# Patient Record
Sex: Male | Born: 1965 | Race: White | Hispanic: No
Health system: Midwestern US, Academic
[De-identification: ages and names within clinical notes are randomized; demographics above are authoritative.]

## PROBLEM LIST (undated history)

## (undated) DIAGNOSIS — N200 Calculus of kidney: Secondary | ICD-10-CM

## (undated) HISTORY — PX: KIDNEY STONE SURGERY: SHX686

## (undated) HISTORY — PX: WRIST SURGERY: SHX841

## (undated) HISTORY — PX: EYE SURGERY: SHX253

---

## 2012-03-25 ENCOUNTER — Emergency Department: Payer: Self-pay | Admitting: Emergency Medicine

## 2012-03-25 LAB — BASIC METABOLIC PANEL
Anion Gap: 9 (ref 7–16)
BUN: 15 mg/dL (ref 7–18)
Calcium, Total: 9.4 mg/dL (ref 8.5–10.1)
Chloride: 110 mmol/L — ABNORMAL HIGH (ref 98–107)
EGFR (Non-African Amer.): 50 — ABNORMAL LOW
Osmolality: 283 (ref 275–301)
Potassium: 4 mmol/L (ref 3.5–5.1)
Sodium: 141 mmol/L (ref 136–145)

## 2012-03-25 LAB — URINALYSIS, COMPLETE
Bacteria: NONE SEEN
Glucose,UR: NEGATIVE mg/dL (ref 0–75)
Leukocyte Esterase: NEGATIVE
Nitrite: NEGATIVE
Protein: NEGATIVE
RBC,UR: 14 /HPF (ref 0–5)
WBC UR: 1 /HPF (ref 0–5)

## 2012-03-25 LAB — CBC
HGB: 14.6 g/dL (ref 12.0–16.0)
Platelet: 269 10*3/uL (ref 150–440)
RBC: 4.11 10*6/uL (ref 3.80–5.20)
WBC: 9.2 10*3/uL (ref 3.6–11.0)

## 2012-11-25 ENCOUNTER — Encounter (HOSPITAL_BASED_OUTPATIENT_CLINIC_OR_DEPARTMENT_OTHER): Payer: Self-pay | Admitting: *Deleted

## 2012-11-25 ENCOUNTER — Emergency Department (HOSPITAL_BASED_OUTPATIENT_CLINIC_OR_DEPARTMENT_OTHER)
Admission: EM | Admit: 2012-11-25 | Discharge: 2012-11-25 | Disposition: A | Discharge status: Home / Self Care | Payer: Self-pay | Attending: Emergency Medicine | Admitting: Emergency Medicine

## 2012-11-25 DIAGNOSIS — R109 Unspecified abdominal pain: Secondary | ICD-10-CM | POA: Insufficient documentation

## 2012-11-25 DIAGNOSIS — Z9889 Other specified postprocedural states: Secondary | ICD-10-CM | POA: Insufficient documentation

## 2012-11-25 DIAGNOSIS — Z87442 Personal history of urinary calculi: Secondary | ICD-10-CM | POA: Insufficient documentation

## 2012-11-25 DIAGNOSIS — N23 Unspecified renal colic: Secondary | ICD-10-CM | POA: Insufficient documentation

## 2012-11-25 DIAGNOSIS — N390 Urinary tract infection, site not specified: Secondary | ICD-10-CM | POA: Insufficient documentation

## 2012-11-25 DIAGNOSIS — F17200 Nicotine dependence, unspecified, uncomplicated: Secondary | ICD-10-CM | POA: Insufficient documentation

## 2012-11-25 HISTORY — DX: Calculus of kidney: N20.0

## 2012-11-25 LAB — URINALYSIS, ROUTINE W REFLEX MICROSCOPIC
Bilirubin Urine: NEGATIVE
Glucose, UA: NEGATIVE mg/dL
Hgb urine dipstick: NEGATIVE
Ketones, ur: NEGATIVE mg/dL
Protein, ur: NEGATIVE mg/dL
pH: 5.5 (ref 5.0–8.0)

## 2012-11-25 LAB — CBC WITH DIFFERENTIAL/PLATELET
Basophils Absolute: 0.1 10*3/uL (ref 0.0–0.1)
Eosinophils Relative: 3 % (ref 0–5)
HCT: 38.1 % — ABNORMAL LOW (ref 39.0–52.0)
Hemoglobin: 13.2 g/dL (ref 13.0–17.0)
Lymphocytes Relative: 10 % — ABNORMAL LOW (ref 12–46)
MCV: 99 fL (ref 78.0–100.0)
Monocytes Absolute: 0.7 10*3/uL (ref 0.1–1.0)
Monocytes Relative: 7 % (ref 3–12)
RDW: 11.9 % (ref 11.5–15.5)
WBC: 9.3 10*3/uL (ref 4.0–10.5)

## 2012-11-25 LAB — BASIC METABOLIC PANEL
BUN: 17 mg/dL (ref 6–23)
CO2: 25 mEq/L (ref 19–32)
Calcium: 9.5 mg/dL (ref 8.4–10.5)
Creatinine, Ser: 1.3 mg/dL (ref 0.50–1.35)
Glucose, Bld: 100 mg/dL — ABNORMAL HIGH (ref 70–99)

## 2012-11-25 LAB — URINE MICROSCOPIC-ADD ON

## 2012-11-25 MED ORDER — ONDANSETRON HCL 4 MG/2ML IJ SOLN
4.0000 mg | Freq: Once | INTRAMUSCULAR | Status: AC
  Administered 2012-11-25: 4 mg via INTRAVENOUS
  Filled 2012-11-25: qty 2

## 2012-11-25 MED ORDER — SULFAMETHOXAZOLE-TMP DS 800-160 MG PO TABS
1.0000 | ORAL_TABLET | Freq: Once | ORAL | Status: AC
  Administered 2012-11-25: 1 via ORAL
  Filled 2012-11-25: qty 1

## 2012-11-25 MED ORDER — HYDROMORPHONE HCL PF 1 MG/ML IJ SOLN
1.0000 mg | Freq: Once | INTRAMUSCULAR | Status: AC
  Administered 2012-11-25: 1 mg via INTRAVENOUS
  Filled 2012-11-25: qty 1

## 2012-11-25 MED ORDER — SULFAMETHOXAZOLE-TRIMETHOPRIM 800-160 MG PO TABS: 1.0000 | ORAL_TABLET | Freq: Two times a day (BID) | ORAL | Status: DC

## 2012-11-25 MED ORDER — OXYCODONE-ACETAMINOPHEN 5-325 MG PO TABS: 1.0000 | ORAL_TABLET | ORAL | Status: DC | PRN

## 2012-11-25 MED ORDER — SODIUM CHLORIDE 0.9 % IV BOLUS (SEPSIS)
1000.0000 mL | Freq: Once | INTRAVENOUS | Status: AC
  Administered 2012-11-25: 1000 mL via INTRAVENOUS

## 2012-11-25 NOTE — ED Provider Notes (Signed)
CSN: [id number]     Arrival date & time 11/25/12  4098 History     First MD Initiated Contact with Patient 11/25/12 [phone]     Chief Complaint  Patient presents with  . Nephrolithiasis   (Consider location/radiation/quality/duration/timing/severity/associated sxs/prior Treatment) The history is provided by the patient.  Jack Patton is a 47 y.o. male history kidney stones here presenting with left flank pain. 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 He was prescribed Norco with minimal relief. He is here today because he has worsening left-sided pain that is sharp and constant. Denies any nausea or vomiting or constipation diarrhea. Denies any hematuria. Denies any fevers or chills. 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   Past Medical History  Diagnosis Date  . Kidney stones, calcium oxalate    Past Surgical History  Procedure Laterality Date  . Kidney stone surgery    . Wrist surgery    . Eye surgery     History reviewed. No pertinent family history. History  Substance Use Topics  . Smoking status: Current Every Day Smoker -- 0.50 packs/day    Types: Cigarettes  . Smokeless tobacco: Not on file  . Alcohol Use: No    Review of Systems  Genitourinary: Positive for flank pain.  All other systems reviewed and are negative.    Allergies  Flomax; Nsaids; and Other  Home Medications  No current outpatient prescriptions on file. BP 131/85  Pulse 85  Temp(Src) 98.8 F (37.1 C) (Oral)  Resp 16  Ht 5\' 6"  (1.676 m)  Wt 190 lb (86.183 kg)  BMI 30.68 kg/m2  SpO2 100% Physical Exam  Nursing note and vitals reviewed. Constitutional: He appears well-developed and well-nourished.  Uncomfortable   HENT:  Head: Normocephalic.  Mouth/Throat: Oropharynx is clear and moist.  Eyes: Pupils are equal, round, and reactive to light.  Neck: Normal range of motion.  Cardiovascular: Normal rate, regular rhythm and normal heart  sounds.   Pulmonary/Chest: Effort normal and breath sounds normal. No respiratory distress. He has no wheezes. He has no rales.  Abdominal: Soft. Bowel sounds are normal. He exhibits no distension. There is no tenderness. There is no rebound and no guarding.  + L CVAT   Musculoskeletal: Normal range of motion.  Neurological: He is alert.  Skin: Skin is warm and dry.  Psychiatric: He has a normal mood and affect. His behavior is normal. Judgment and thought content normal.    ED Course   Procedures (including critical care time)  Labs Reviewed  CBC WITH DIFFERENTIAL - Abnormal; Notable for the following:    RBC 3.85 (*)    HCT 38.1 (*)    MCH 34.3 (*)    Neutrophils Relative % 80 (*)    Lymphocytes Relative 10 (*)    All other components within normal limits  BASIC METABOLIC PANEL - Abnormal; Notable for the following:    Glucose, Bld 100 (*)    GFR calc non Af Amer 64 (*)    GFR calc Af Amer 74 (*)    All other components within normal limits  URINALYSIS, ROUTINE W REFLEX MICROSCOPIC - Abnormal; Notable for the following:    Leukocytes, UA SMALL (*)    All other components within normal limits  URINE MICROSCOPIC-ADD ON - Abnormal; Notable for the following:    Bacteria, UA FEW (*)    Crystals URIC ACID CRYSTALS (*)  All other components within normal limits   No results found. No diagnosis found.  MDM  Jack Patton is a 47 y.o. male here with L flank pain. Likely renal colic. He rather not get another CT since he had one 2 days ago. He says that he has hx of multiple stones and has urology f/u. Will check labs and UA and give pain meds and reassess.   12:09 PM UA showed possible UTI but no blood. I reviewed records from [facility] in [place]. He did have CT 2 days ago that showed 3 mm stone. He was sent home on abx which he never fills. Will d/c home on bactrim and percocet.   Richardean Canal, MD 11/25/12 [phone]

## 2012-11-25 NOTE — ED Notes (Signed)
Pt sts he is going to sleep in his 18-wheeler truck in parking lot until 2pm since pt had narcotics..MD aware. Security notified also.

## 2012-11-25 NOTE — ED Notes (Signed)
Pt reports kidney stone confirmed on CT scan 2 days ago in [place].  Pt a truck driver.  States that he has (L) sided flank pain.  Pt has a 3mm stone and 2 fragments.

## 2013-01-12 ENCOUNTER — Emergency Department (HOSPITAL_BASED_OUTPATIENT_CLINIC_OR_DEPARTMENT_OTHER): Payer: Self-pay

## 2013-01-12 ENCOUNTER — Encounter (HOSPITAL_BASED_OUTPATIENT_CLINIC_OR_DEPARTMENT_OTHER): Payer: Self-pay | Admitting: *Deleted

## 2013-01-12 ENCOUNTER — Emergency Department (HOSPITAL_BASED_OUTPATIENT_CLINIC_OR_DEPARTMENT_OTHER)
Admission: EM | Admit: 2013-01-12 | Discharge: 2013-01-12 | Disposition: A | Discharge status: Home / Self Care | Payer: Self-pay | Attending: Emergency Medicine | Admitting: Emergency Medicine

## 2013-01-12 DIAGNOSIS — F17200 Nicotine dependence, unspecified, uncomplicated: Secondary | ICD-10-CM | POA: Insufficient documentation

## 2013-01-12 DIAGNOSIS — N200 Calculus of kidney: Secondary | ICD-10-CM | POA: Insufficient documentation

## 2013-01-12 LAB — URINALYSIS, ROUTINE W REFLEX MICROSCOPIC
Nitrite: NEGATIVE
Protein, ur: NEGATIVE mg/dL
Specific Gravity, Urine: 1.028 (ref 1.005–1.030)
Urobilinogen, UA: 1 mg/dL (ref 0.0–1.0)

## 2013-01-12 LAB — BASIC METABOLIC PANEL
BUN: 19 mg/dL (ref 6–23)
Calcium: 9.8 mg/dL (ref 8.4–10.5)
GFR calc Af Amer: 74 mL/min — ABNORMAL LOW (ref >90)
GFR calc non Af Amer: 64 mL/min — ABNORMAL LOW (ref >90)
Glucose, Bld: 119 mg/dL — ABNORMAL HIGH (ref 70–99)
Potassium: 3.8 mEq/L (ref 3.5–5.1)
Sodium: 140 mEq/L (ref 135–145)

## 2013-01-12 MED ORDER — HYDROMORPHONE HCL PF 2 MG/ML IJ SOLN
INTRAMUSCULAR | Status: AC
  Administered 2013-01-12: 1 mg
  Filled 2013-01-12: qty 1

## 2013-01-12 MED ORDER — HYDROMORPHONE HCL PF 2 MG/ML IJ SOLN
INTRAMUSCULAR | Status: AC
  Administered 2013-01-12: 2 mg via INTRAVENOUS
  Filled 2013-01-12: qty 1

## 2013-01-12 MED ORDER — OXYCODONE-ACETAMINOPHEN 5-325 MG PO TABS: 1.0000 | ORAL_TABLET | ORAL | Status: AC | PRN

## 2013-01-12 MED ORDER — HYDROMORPHONE HCL PF 1 MG/ML IJ SOLN: 1.0000 mg | Freq: Once | INTRAMUSCULAR | Status: AC

## 2013-01-12 MED ORDER — ONDANSETRON HCL 4 MG/2ML IJ SOLN
4.0000 mg | Freq: Once | INTRAMUSCULAR | Status: AC
  Administered 2013-01-12: 4 mg via INTRAVENOUS
  Filled 2013-01-12: qty 2

## 2013-01-12 MED ORDER — HYDROMORPHONE HCL PF 2 MG/ML IJ SOLN: 2.0000 mg | Freq: Once | INTRAMUSCULAR | Status: AC

## 2013-01-12 NOTE — ED Provider Notes (Signed)
CSN: [id number]     Arrival date & time 01/12/13  1944 History   First MD Initiated Contact with Patient 01/12/13 1957     Chief Complaint  Patient presents with  . Flank Pain   (Consider location/radiation/quality/duration/timing/severity/associated sxs/prior Treatment) HPI Comments: Right flank pain since yesterday:history of stones:pt is not from this area:states had 4 ct scans 6 months WUJ:WJXBJY dysuria  Patient is a Jack Patton presenting with flank pain. The history is provided by the patient. No language interpreter was used.  Flank Pain This is a recurrent problem. The current episode started yesterday. The problem occurs constantly. The problem has been unchanged. Pertinent negatives include no fever. Nothing aggravates the symptoms. He has tried nothing for the symptoms.    Past Medical History  Diagnosis Date  . Kidney stones, calcium oxalate    Past Surgical History  Procedure Laterality Date  . Kidney stone surgery    . Wrist surgery    . Eye surgery     History reviewed. No pertinent family history. History  Substance Use Topics  . Smoking status: Current Every Day Smoker -- 0.50 packs/day    Types: Cigarettes  . Smokeless tobacco: Not on file  . Alcohol Use: No    Review of Systems  Constitutional: Negative for fever.  Respiratory: Negative.   Cardiovascular: Negative.   Genitourinary: Positive for flank pain.    Allergies  Flomax; Nsaids; and Other  Home Medications  No current outpatient prescriptions on file. BP 136/88  Pulse 88  Temp(Src) 98.3 F (36.8 C) (Oral)  Resp 18  Ht 5\' 6"  (1.676 m)  Wt 180 lb (81.647 kg)  BMI 29.07 kg/m2  SpO2 98% Physical Exam  Nursing note and vitals reviewed. Constitutional: He is oriented to person, place, and time. He appears well-developed and well-nourished.  HENT:  Head: Normocephalic and atraumatic.  Cardiovascular: Normal rate and regular rhythm.   Pulmonary/Chest: Effort normal and breath sounds  normal.  Abdominal: Bowel sounds are normal. There is no tenderness.  Musculoskeletal: Normal range of motion.  Neurological: He is alert and oriented to person, place, and time.  Skin: Skin is warm and dry.  Psychiatric: He has a normal mood and affect.    ED Course  Procedures (including critical care time) Labs Review Labs Reviewed  BASIC METABOLIC PANEL - Abnormal; Notable for the following:    Glucose, Bld 119 (*)    GFR calc non Af Amer 64 (*)    GFR calc Af Amer 74 (*)    All other components within normal limits  URINALYSIS, ROUTINE W REFLEX MICROSCOPIC   Imaging Review Dg Abd 1 View  01/12/2013   CLINICAL DATA:  Right flank pain. Nausea and vomiting.  EXAM: ABDOMEN - 1 VIEW  COMPARISON:  None.  FINDINGS: The bowel gas pattern is normal. No radio-opaque calculi stool obscures visualization of the kidneys. Several tiny radiodensities in the right abdomen are suspicious for small right renal calculi. No definite ureteral calculi visualized. Right pelvic phlebolith noted. Severe lumbar spine degenerative changes and scoliosis noted.  IMPRESSION: Probable right nephrolithiasis.  No acute findings identified.   Electronically Signed   By: Myles Rosenthal M.D.   On: 01/12/2013 20:53    MDM   1. Kidney stone    Pt is more comfortable at this time:x-ray done as pt has had multiple scans:no fever or infection noted in the urine:pt can follow up with the urologist when he gets home    Teressa Lower, NP  01/12/13 2158 

## 2013-01-12 NOTE — ED Notes (Signed)
Pt c/o right flank pain x 12 hrs hx kidney stones

## 2013-01-12 NOTE — ED Provider Notes (Signed)
  Medical screening examination/treatment/procedure(s) were performed by non-physician practitioner and as supervising physician I was immediately available for consultation/collaboration.   Willma Obando, MD 01/12/13 2319 

## 2013-02-07 ENCOUNTER — Emergency Department: Admit: 2013-02-08

## 2013-02-07 DIAGNOSIS — N201 Calculus of ureter: Secondary | ICD-10-CM

## 2013-02-07 MED ORDER — sodium chloride 0.9 % 1,000 mL bolus
Freq: Once | INTRAVENOUS | Status: AC
Start: 2013-02-07 — End: 2013-02-08
  Administered 2013-02-08: 03:00:00 via INTRAVENOUS

## 2013-02-07 MED ORDER — morphine Syrg 10 mg
10 | INTRAMUSCULAR | Status: AC | PRN
Start: 2013-02-07 — End: 2013-02-08
  Administered 2013-02-08: 03:00:00 via INTRAVENOUS

## 2013-02-07 MED ORDER — ketorolac (TORADOL) injection 15 mg
15 | Freq: Once | INTRAMUSCULAR | Status: AC
Start: 2013-02-07 — End: 2013-02-08

## 2013-02-07 MED ORDER — oxyCODONE-acetaminophen (PERCOCET) 5-325 mg per tablet 2 tablet
5-325 | Freq: Once | ORAL | Status: AC
Start: 2013-02-07 — End: 2013-02-08
  Administered 2013-02-08: 04:00:00 via ORAL

## 2013-02-07 MED ORDER — ondansetron (ZOFRAN) 4 mg/2 mL injection 4 mg
4 | INTRAMUSCULAR | Status: AC | PRN
Start: 2013-02-07 — End: 2013-02-08
  Administered 2013-02-08: 03:00:00 via INTRAVENOUS

## 2013-02-07 MED ORDER — tamsulosin (FLOMAX) capsule Cp24 0.4 mg
0.4 | Freq: Once | ORAL | Status: AC
Start: 2013-02-07 — End: 2013-02-08

## 2013-02-07 MED FILL — ONDANSETRON HCL (PF) 4 MG/2 ML INJECTION SOLUTION: 4 4 mg/2 mL | INTRAMUSCULAR | Qty: 2

## 2013-02-07 MED FILL — MORPHINE 10 MG/ML INJECTION SYRINGE: 10 10 mg/mL | INTRAMUSCULAR | Qty: 1

## 2013-02-07 MED FILL — SODIUM CHLORIDE 0.9 % INTRAVENOUS SOLUTION: INTRAVENOUS | Qty: 1000

## 2013-02-07 NOTE — Unmapped (Signed)
Ordered medications given, see MAR. Pt refused toradol, states it makes him itch. CT tech to room to take Pt to radiology.

## 2013-02-07 NOTE — Unmapped (Signed)
[place] ED Note    Date of Service 02/07/2013    Reason for Visit: Flank Pain      Patient History     HPI: Dennis Reeves is a 47 y.o. male who presents with left flank pain for the past 6 hours.  The patient states he has a history of kidney stones the pain started in his back radiates to her his left testicle.  Some nausea but no vomiting.  No blood seen in his urine some burning with urination.  No falls or injuries.  No fevers or chills.  No right-sided abdominal pain.  Last passed a stone in March of this year.      Family History       Past Medical History   Diagnosis Date   ??? Kidney stones        Past Surgical History   Procedure Laterality Date   ??? Lithotripsy     ??? Eye surgery     ??? Wrist surgery Right         reports that he has been smoking Cigarettes.  He has been smoking about 0.50 packs per day. He does not have any smokeless tobacco history on file. He reports that he does not drink alcohol or use illicit drugs.    Previous Medications    No medications on file       Allergies:   Allergies as of 02/07/2013   ??? (No Known Allergies)       Review of Systems     ROS: No fevers or chills, positive for left flank pain.  Negative for chest pain.  Positive for occasional cough is a smoker.  Negative for blood in the urine or blood in stools noted.  All other ROS were negative.    Physical Exam     ED Triage Vitals   Vital Signs Group      Temp 02/07/13 2148 98.6 ??F (37 ??C)      Temp Source 02/07/13 2148 Oral      Heart Rate 02/07/13 2148 93      Heart Rate Source 02/07/13 2148 Monitor      Resp 02/07/13 2148 18      BP 02/07/13 2148 131/74 mmHg      BP Location 02/07/13 2148 Right arm      BP Method 02/07/13 2148 Automatic      Patient Position 02/07/13 2148 Sitting   SpO2 02/07/13 2148 99 %   O2 Device 02/07/13 2148 None (Room air)     Filed Vitals:    02/07/13 2148   BP: 131/74   Pulse: 93   Temp: 98.6 ??F (37 ??C)   TempSrc: Oral   Resp: 18   Height: 5' 6  (1.676 m)   Weight: 181 lb 10.5 oz (82.399 kg)   SpO2: 99%        Constitutional:  Well developed, well nourished, looking uncomfortable laying bed  Eyes:  Sclera anicteric, conjunctiva normal pupils are equal round react to light  HENT:  Atraumatic, external ears normal, nose normal, oropharynx moist.   Neck- normal range of motion, supple   Respiratory:  No respiratory distress, normal breath sounds, Good air movement   Cardiovascular:  Regular rate, no murmurs, no gallops, no rubs   GI:  Soft, nondistended, nontender, no rebound, no guarding bowel sounds are present  Musculoskeletal:  No edema, no deformities.   Skin:  No rash or nodules noted.   Neurologic:  Awake and alert.  Moves all four extremities.  Light touch intact.  Cranial nerves 2-12 are intact  Psychiatric:  Normal mood.  Behavior appropriate       Diagnostic Studies     Labs:    Labs Reviewed   CBC - Abnormal; Notable for the following:     RBC 3.99 (*)     MCV 100.5 (*)     MCH 33.9 (*)     MPV 7.2 (*)     All other components within normal limits   URINALYSIS W/ REFLEX TO MICROSCOPIC - Abnormal; Notable for the following:     Clarity, UA Slightly-Cloudy (*)     Protein, UA 30 (*)     Ketones, UA 5 (*)     Blood, UA Small (*)     Urobilinogen, UA 4.0 (*)     Leukocytes, UA Trace (*)     RBC, UA 21 (*)     WBC, UA 8 (*)     Bacteria, UA Few (*)     Mucus, UA Present (*)     Ca Oxalate Crys, UA Moderate (*)     All other components within normal limits   BASIC METABOLIC PANEL    Narrative:     Slight hemolysis present.   DIFFERENTIAL       Radiology:    2 mm stone left side in the ureter causing hydronephrosis        Emergency Department Procedures         ED Course and MDM     Medications   ondansetron (ZOFRAN) 4 mg/2 mL injection 4 mg (4 mg Intravenous Given 02/07/13 2231)   ketorolac (TORADOL) injection 15 mg (15 mg Intravenous Not Given 02/07/13 2231)   morphine Syrg 10 mg (10 mg Intravenous Given 02/07/13 2231)   oxyCODONE-acetaminophen  (PERCOCET) 5-325 mg per tablet 2 tablet (not administered)   tamsulosin (FLOMAX) capsule Cp24 0.4 mg (not administered)   sodium chloride 0.9 % 1,000 mL bolus ( Intravenous New Bag 02/07/13 2231)       Wt Readings from Last 3 Encounters:   02/07/13 181 lb 10.5 oz (82.399 kg)     Temp Readings from Last 3 Encounters:   02/07/13 98.6 ??F (37 ??C) Oral     BP Readings from Last 3 Encounters:   02/07/13 131/74     Pulse Readings from Last 3 Encounters:   02/07/13 93       Dennis Reeves is a 47 y.o. male who presented to the emergency department with Flank Pain      The patient comes in quite uncomfortable with sudden onset left flank pain therefore an IV was started and pain medication and nausea medication was given.  Because of the severe flank pain and history of kidney stones a CT scan of the abdomen and pelvis ordered without contrast.  CT scan showed a 2 mm stone in the left ureter causing hydronephrosis.  The patient was comfortable after his IV pain medication.  I will give him some oral Percocet and reassess his pain control    At midnight the patient is comfortable with going home and his pain is controlled.  He will go home on Percocet and will not take Flomax do to prior issues with a priapism.  I will put him on Keflex for a week to 2 the white blood cells in his urine and I will culture his urine although I don't think this is a clear-cut infection requires admission to the hospital  this time.  This is awake alert and stable I discussed his laboratory and CT scan findings with him.    Clinical Impression    Left ureteral calculi    Left hydronephrosis    Multiple kidney stones    Left renal colic      Critical Care Time (Attendings)       Martie Round, MD  02/08/13 [phone]

## 2013-02-07 NOTE — Unmapped (Signed)
C/o L flank pain & pain w/ urination x6hrs. Hx of kidney stones.

## 2013-02-08 ENCOUNTER — Inpatient Hospital Stay: Admit: 2013-02-08 | Discharge: 2013-02-08 | Disposition: A | Discharge status: Home / Self Care

## 2013-02-08 LAB — BASIC METABOLIC PANEL
Anion Gap: 9 mmol/L (ref 3–16)
BUN: 19 mg/dL (ref 7–25)
CO2: 23 mmol/L (ref 21–33)
Calcium: 9.1 mg/dL (ref 8.6–10.2)
Chloride: 106 mmol/L (ref 98–110)
Creatinine: 1.23 mg/dL (ref 0.50–1.30)
GFR MDRD Af Amer: 76 See note.
GFR MDRD Non Af Amer: 63 See note.
Glucose: 89 mg/dL (ref 65–99)
Osmolality, Calculated: 288 mosm/kg (ref 278–305)
Potassium: 4.1 mmol/L (ref 3.5–5.3)
Sodium: 138 mmol/L (ref 135–146)

## 2013-02-08 LAB — URINALYSIS W/RFL TO MICROSCOPIC
Bilirubin, UA: NEGATIVE
Glucose, UA: NEGATIVE mg/dL
Ketones, UA: 5 mg/dL — AB
Nitrite, UA: NEGATIVE
Protein, UA: 30 mg/dL — AB
RBC, UA: 21 /HPF — ABNORMAL HIGH (ref 0–3)
Specific Gravity, UA: 1.024 (ref 1.005–1.035)
Urobilinogen, UA: 4 mg/dL — ABNORMAL HIGH (ref 0.2–1.9)
WBC, UA: 8 /HPF — ABNORMAL HIGH (ref 0–5)
pH, UA: 5 (ref 5.0–8.0)

## 2013-02-08 LAB — DIFFERENTIAL
Basophils Absolute: 35 /uL (ref 0–200)
Basophils Relative: 0.4 % (ref 0.0–1.0)
Eosinophils Absolute: 202 /uL (ref 15–500)
Eosinophils Relative: 2.3 % (ref 0.0–8.0)
Lymphocytes Absolute: 1742 /uL (ref 850–3900)
Lymphocytes Relative: 19.8 % (ref 15.0–45.0)
Monocytes Absolute: 739 /uL (ref 200–950)
Monocytes Relative: 8.4 % (ref 0.0–12.0)
Neutrophils Absolute: 6081 /uL (ref 1500–7800)
Neutrophils Relative: 69.1 % (ref 40.0–80.0)

## 2013-02-08 LAB — CBC
Hematocrit: 40.1 % (ref 38.5–50.0)
Hemoglobin: 13.5 g/dL (ref 13.2–17.1)
MCH: 33.9 pg — ABNORMAL HIGH (ref 27.0–33.0)
MCHC: 33.7 g/dL (ref 32.0–36.0)
MCV: 100.5 fL — ABNORMAL HIGH (ref 80.0–100.0)
MPV: 7.2 fL — ABNORMAL LOW (ref 7.5–11.5)
Platelets: 303 10E3/uL (ref 140–400)
RBC: 3.99 10E6/uL — ABNORMAL LOW (ref 4.20–5.80)
RDW: 14 % (ref 11.0–15.0)
WBC: 8.8 10E3/uL (ref 3.8–10.8)

## 2013-02-08 MED ORDER — cephALEXin (KEFLEX) 500 MG capsule
500 | ORAL_CAPSULE | Freq: Two times a day (BID) | ORAL | Status: AC
Start: 2013-02-08 — End: 2015-05-20

## 2013-02-08 MED ORDER — cephALEXin (KEFLEX) capsule 500 mg
500 | Freq: Once | ORAL | Status: AC
Start: 2013-02-08 — End: 2013-02-08
  Administered 2013-02-08: 04:00:00 via ORAL

## 2013-02-08 MED ORDER — oxyCODONE-acetaminophen (PERCOCET) 5-325 mg per tablet
5-325 | ORAL_TABLET | ORAL | Status: AC | PRN
Start: 2013-02-08 — End: 2015-05-20

## 2013-02-08 MED FILL — CEPHALEXIN 500 MG CAPSULE: 500 500 MG | ORAL | Qty: 1

## 2013-02-08 MED FILL — KETOROLAC 15 MG/ML INJECTION SOLUTION: 15 15 mg/mL | INTRAMUSCULAR | Qty: 1

## 2013-02-08 MED FILL — TAMSULOSIN 0.4 MG CAPSULE: 0.4 0.4 mg | ORAL | Qty: 1

## 2013-02-08 MED FILL — OXYCODONE-ACETAMINOPHEN 5 MG-325 MG TABLET: 5-325 5-325 mg | ORAL | Qty: 2

## 2013-02-08 NOTE — Unmapped (Signed)
Return to the emergency department immediately for worsening symptoms, fevers or other signs of infection, or inability to urinate

## 2013-02-09 NOTE — Unmapped (Signed)
[place] pharmacy called to clarify percocet order written by Dr Boneta Lucks.

## 2013-03-28 ENCOUNTER — Encounter (HOSPITAL_BASED_OUTPATIENT_CLINIC_OR_DEPARTMENT_OTHER): Payer: Self-pay | Admitting: Emergency Medicine

## 2013-03-28 ENCOUNTER — Emergency Department (HOSPITAL_BASED_OUTPATIENT_CLINIC_OR_DEPARTMENT_OTHER)
Admission: EM | Admit: 2013-03-28 | Discharge: 2013-03-28 | Disposition: A | Discharge status: Home / Self Care | Payer: Self-pay | Attending: Emergency Medicine | Admitting: Emergency Medicine

## 2013-03-28 ENCOUNTER — Emergency Department (HOSPITAL_BASED_OUTPATIENT_CLINIC_OR_DEPARTMENT_OTHER): Payer: Self-pay

## 2013-03-28 DIAGNOSIS — R110 Nausea: Secondary | ICD-10-CM | POA: Insufficient documentation

## 2013-03-28 DIAGNOSIS — F17200 Nicotine dependence, unspecified, uncomplicated: Secondary | ICD-10-CM | POA: Insufficient documentation

## 2013-03-28 DIAGNOSIS — M549 Dorsalgia, unspecified: Secondary | ICD-10-CM | POA: Insufficient documentation

## 2013-03-28 DIAGNOSIS — Z87442 Personal history of urinary calculi: Secondary | ICD-10-CM | POA: Insufficient documentation

## 2013-03-28 LAB — URINALYSIS, ROUTINE W REFLEX MICROSCOPIC
Hgb urine dipstick: NEGATIVE
Nitrite: NEGATIVE
Protein, ur: NEGATIVE mg/dL
Specific Gravity, Urine: 1.026 (ref 1.005–1.030)
Urobilinogen, UA: 1 mg/dL (ref 0.0–1.0)

## 2013-03-28 MED ORDER — HYDROCODONE-ACETAMINOPHEN 5-325 MG PO TABS: 2.0000 | ORAL_TABLET | ORAL | Status: AC | PRN

## 2013-03-28 MED ORDER — ONDANSETRON 4 MG PO TBDP
4.0000 mg | ORAL_TABLET | Freq: Once | ORAL | Status: AC
  Administered 2013-03-28: 4 mg via ORAL
  Filled 2013-03-28: qty 1

## 2013-03-28 MED ORDER — HYDROMORPHONE HCL PF 2 MG/ML IJ SOLN
2.0000 mg | Freq: Once | INTRAMUSCULAR | Status: AC
  Administered 2013-03-28: 2 mg via INTRAMUSCULAR
  Filled 2013-03-28: qty 1

## 2013-03-28 NOTE — ED Provider Notes (Signed)
CSN: [id number]     Arrival date & time 03/28/13  1653 History   First MD Initiated Contact with Patient 03/28/13 1715     Chief Complaint  Patient presents with  . Flank Pain   (Consider location/radiation/quality/duration/timing/severity/associated sxs/prior Treatment) Patient is a 47 y.o. male presenting with flank pain. The history is provided by the patient. No language interpreter was used.  Flank Pain This is a new problem. The current episode started today. The problem occurs constantly. The problem has been gradually worsening. Associated symptoms include abdominal pain and nausea. Nothing aggravates the symptoms. He has tried nothing for the symptoms. The treatment provided no relief.  Pt reports he has pain in his right back and right abdomen.  Pr thinks he may have a kidney stone.   Past Medical History  Diagnosis Date  . Kidney stones, calcium oxalate    Past Surgical History  Procedure Laterality Date  . Kidney stone surgery    . Wrist surgery    . Eye surgery     History reviewed. No pertinent family history. History  Substance Use Topics  . Smoking status: Current Every Day Smoker -- 0.50 packs/day    Types: Cigarettes  . Smokeless tobacco: Not on file  . Alcohol Use: No    Review of Systems  Gastrointestinal: Positive for nausea and abdominal pain.  Genitourinary: Positive for flank pain.  All other systems reviewed and are negative.    Allergies  Flomax; Nsaids; and Other  Home Medications   Current Outpatient Rx  Name  Route  Sig  Dispense  Refill  . oxyCODONE-acetaminophen (PERCOCET/ROXICET) 5-325 MG per tablet   Oral   Take 1 tablet by mouth every 4 (four) hours as needed for pain.   15 tablet   0    BP 124/89  Pulse 69  Temp(Src) 98.3 F (36.8 C) (Oral)  Resp 18  SpO2 98% Physical Exam  Constitutional: He appears well-developed and well-nourished.  HENT:  Head: Normocephalic.  Right Ear: External ear normal.  Left Ear: External  ear normal.  Mouth/Throat: Oropharynx is clear and moist.  Eyes: Conjunctivae are normal. Pupils are equal, round, and reactive to light.  Neck: Normal range of motion. Neck supple.  Cardiovascular: Normal rate and normal heart sounds.   Pulmonary/Chest: Effort normal.  Abdominal: Soft.  Musculoskeletal: Normal range of motion.  Neurological: He is alert.  Skin: Skin is warm.    ED Course  Procedures (including critical care time) Labs Review Labs Reviewed  URINALYSIS, ROUTINE W REFLEX MICROSCOPIC - Abnormal; Notable for the following:    Bilirubin Urine SMALL (*)    Ketones, ur 15 (*)    All other components within normal limits   Imaging Review No results found.  EKG Interpretation   None       MDM   1. Back pain    Ct ascan reviewed.  Pt is visiting.   I advised him on need for followup of liver nodule and lung nodule.  Pt given rx for hydrocodone for pain,    Elson Areas, PA-C 03/28/13 1916

## 2013-03-28 NOTE — ED Provider Notes (Signed)
Medical screening examination/treatment/procedure(s) were performed by non-physician practitioner and as supervising physician I was immediately available for consultation/collaboration.  EKG Interpretation   None         Jahnay Lantier, MD 03/28/13 2105 

## 2013-03-28 NOTE — ED Notes (Signed)
Pt amb to triage with quick steady gait smiling in nad. Pt reports his usual right flank pain x 2 hours, hx of renal stones.

## 2013-05-09 ENCOUNTER — Emergency Department (HOSPITAL_BASED_OUTPATIENT_CLINIC_OR_DEPARTMENT_OTHER): Payer: Self-pay

## 2013-05-09 ENCOUNTER — Emergency Department (HOSPITAL_BASED_OUTPATIENT_CLINIC_OR_DEPARTMENT_OTHER)
Admission: EM | Admit: 2013-05-09 | Discharge: 2013-05-09 | Disposition: A | Discharge status: Home / Self Care | Payer: Self-pay | Attending: Emergency Medicine | Admitting: Emergency Medicine

## 2013-05-09 ENCOUNTER — Encounter (HOSPITAL_BASED_OUTPATIENT_CLINIC_OR_DEPARTMENT_OTHER): Payer: Self-pay | Admitting: Emergency Medicine

## 2013-05-09 DIAGNOSIS — N390 Urinary tract infection, site not specified: Secondary | ICD-10-CM | POA: Insufficient documentation

## 2013-05-09 DIAGNOSIS — F17200 Nicotine dependence, unspecified, uncomplicated: Secondary | ICD-10-CM | POA: Insufficient documentation

## 2013-05-09 DIAGNOSIS — Z87442 Personal history of urinary calculi: Secondary | ICD-10-CM | POA: Insufficient documentation

## 2013-05-09 LAB — CBC WITH DIFFERENTIAL/PLATELET
BASOS ABS: 0.1 10*3/uL (ref 0.0–0.1)
Basophils Relative: 1 % (ref 0–1)
Eosinophils Absolute: 0.3 10*3/uL (ref 0.0–0.7)
Eosinophils Relative: 6 % — ABNORMAL HIGH (ref 0–5)
HEMATOCRIT: 36.7 % — AB (ref 39.0–52.0)
HEMOGLOBIN: 13 g/dL (ref 13.0–17.0)
LYMPHS PCT: 22 % (ref 12–46)
Lymphs Abs: 1.1 10*3/uL (ref 0.7–4.0)
MCH: 34.6 pg — ABNORMAL HIGH (ref 26.0–34.0)
MCHC: 35.4 g/dL (ref 30.0–36.0)
MCV: 97.6 fL (ref 78.0–100.0)
MONO ABS: 0.5 10*3/uL (ref 0.1–1.0)
MONOS PCT: 10 % (ref 3–12)
NEUTROS ABS: 3 10*3/uL (ref 1.7–7.7)
Neutrophils Relative %: 61 % (ref 43–77)
Platelets: 262 10*3/uL (ref 150–400)
RBC: 3.76 MIL/uL — AB (ref 4.22–5.81)
RDW: 12.2 % (ref 11.5–15.5)
WBC: 5 10*3/uL (ref 4.0–10.5)

## 2013-05-09 LAB — URINALYSIS, ROUTINE W REFLEX MICROSCOPIC
BILIRUBIN URINE: NEGATIVE
Glucose, UA: NEGATIVE mg/dL
Ketones, ur: NEGATIVE mg/dL
NITRITE: NEGATIVE
PH: 6 (ref 5.0–8.0)
Protein, ur: NEGATIVE mg/dL
SPECIFIC GRAVITY, URINE: 1.021 (ref 1.005–1.030)
Urobilinogen, UA: 1 mg/dL (ref 0.0–1.0)

## 2013-05-09 LAB — URINE MICROSCOPIC-ADD ON

## 2013-05-09 LAB — BASIC METABOLIC PANEL
BUN: 12 mg/dL (ref 6–23)
CHLORIDE: 103 meq/L (ref 96–112)
CO2: 24 meq/L (ref 19–32)
CREATININE: 1.1 mg/dL (ref 0.50–1.35)
Calcium: 8.9 mg/dL (ref 8.4–10.5)
GFR calc Af Amer: >90 mL/min (ref >90)
GFR calc non Af Amer: 78 mL/min — ABNORMAL LOW (ref >90)
Glucose, Bld: 106 mg/dL — ABNORMAL HIGH (ref 70–99)
Potassium: 4.3 mEq/L (ref 3.7–5.3)
Sodium: 140 mEq/L (ref 137–147)

## 2013-05-09 MED ORDER — HYDROCODONE-ACETAMINOPHEN 5-325 MG PO TABS: 1.0000 | ORAL_TABLET | ORAL | Status: AC | PRN

## 2013-05-09 MED ORDER — SODIUM CHLORIDE 0.9 % IV BOLUS (SEPSIS)
1000.0000 mL | Freq: Once | INTRAVENOUS | Status: AC
  Administered 2013-05-09: 1000 mL via INTRAVENOUS

## 2013-05-09 MED ORDER — MORPHINE SULFATE 4 MG/ML IJ SOLN
4.0000 mg | Freq: Once | INTRAMUSCULAR | Status: AC
  Administered 2013-05-09: 4 mg via INTRAVENOUS
  Filled 2013-05-09: qty 1

## 2013-05-09 MED ORDER — ONDANSETRON HCL 4 MG/2ML IJ SOLN
4.0000 mg | Freq: Once | INTRAMUSCULAR | Status: AC
  Administered 2013-05-09: 4 mg via INTRAVENOUS
  Filled 2013-05-09: qty 2

## 2013-05-09 NOTE — ED Notes (Addendum)
Left flank pain radiating to groin x 4 hours-states feels like kidney stone pain-took last percocet when pain started

## 2013-05-09 NOTE — ED Provider Notes (Addendum)
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 He has noticed a small amount of blood in his urine. No dysuria. No penile discharge. No testicular swelling. No fevers or chills. No nausea or vomiting or diarrhea. He states this is like his prior kidney stone.  ROS: See HPI Constitutional: no fever  Eyes: no drainage  ENT: no runny nose   Cardiovascular:  no chest pain  Resp: no SOB  GI: no vomiting GU: no dysuria Integumentary: no rash  Allergy: no hives  Musculoskeletal: no leg swelling  Neurological: no slurred speech ROS otherwise negative  PAST MEDICAL HISTORY/PAST SURGICAL HISTORY:  Past Medical History  Diagnosis Date  . Kidney stones, calcium oxalate     MEDICATIONS:  Prior to Admission medications   Medication Sig Start Date End Date Taking? Authorizing Provider  HYDROcodone-acetaminophen (NORCO/VICODIN) 5-325 MG per tablet Take 2 tablets by mouth every 4 (four) hours as needed. 03/28/13   Elson Areas, PA-C  oxyCODONE-acetaminophen (PERCOCET/ROXICET) 5-325 MG per tablet Take 1 tablet by mouth every 4 (four) hours as needed for pain. 01/12/13   Teressa Lower, NP    ALLERGIES:  Allergies  Allergen Reactions  . Flomax [Tamsulosin Hcl]   . Nsaids   . Other     phenegran    SOCIAL HISTORY:  History  Substance Use Topics  . Smoking status: Current Every Day Smoker -- 0.50 packs/day    Types: Cigarettes  . Smokeless tobacco: Not on file  . Alcohol Use: No    FAMILY HISTORY: No family history on file.  EXAM: BP 128/76  Pulse 91  Temp(Src) 98.2 F (36.8 C) (Oral)  Resp 20  Ht 5\' 6"  (1.676 m)  Wt 183 lb (83.008  kg)  BMI 29.55 kg/m2  SpO2 100% CONSTITUTIONAL: Alert and oriented and responds appropriately to questions. Well-appearing; well-nourished in no apparent distress HEAD: Normocephalic EYES: Conjunctivae clear, PERRL ENT: normal nose; no rhinorrhea; moist mucous membranes; pharynx without lesions noted NECK: Supple, no meningismus, no LAD  CARD: RRR; S1 and S2 appreciated; no murmurs, no clicks, no rubs, no gallops RESP: Normal chest excursion without splinting or tachypnea; breath sounds clear and equal bilaterally; no wheezes, no rhonchi, no rales,  ABD/GI: Normal bowel sounds; non-distended; soft, non-tender, no rebound, no guarding GU:  Normal external genitalia, no penile discharge, no testicular pain or swelling, no scrotal masses, 2+ femoral pulses bilaterally, no hernia, no signs of cellulitis or Fournier's gangrene BACK:  The back appears normal and is non-tender to palpation, left CVA tenderness EXT: Normal ROM in all joints; non-tender to palpation; no edema; normal capillary refill; no cyanosis    SKIN: Normal color for age and race; warm NEURO: Moves all extremities equally PSYCH: The patient's mood and manner are appropriate. Grooming and personal hygiene are appropriate.  MEDICAL DECISION MAKING: Patient here with pain typical of his kidney stones. He is well-appearing, hemodynamically stable. Urine shows leukocytes, red blood cells and bacteria. He also has calcium oxalate crystals in his urine.  Given his history of prior history of kidney stones requiring surgery, will obtain labs and CT scan to rule out infected kidney stone. Will give pain  in nausea medication and reassess.  ED PROGRESS: Labs are unremarkable. CT scan shows bilateral kidney stones but no ureterolithiasis or pyelonephritis. Otherwise exam benign. He reports his pain is controlled. We'll discharge him with a small amount of pain medication.  Urine culture pending - will treat with abx. Given return precautions.  We'll have him followup with his urologist. Patient verbalizes understanding and is comfortable with plan.     Layla MawKristen N Ward, DO 05/09/13 2029  Layla MawKristen N Ward, DO 05/10/13 [phone]

## 2013-05-09 NOTE — Discharge Instructions (Signed)
Urinary Tract Infection  Urinary tract infections (UTIs) can develop anywhere along your urinary tract. Your urinary tract is your body's drainage system for removing wastes and extra water. Your urinary tract includes two kidneys, two ureters, a bladder, and a urethra. Your kidneys are a pair of bean-shaped organs. Each kidney is about the size of your fist. They are located below your ribs, one on each side of your spine.  CAUSES  Infections are caused by microbes, which are microscopic organisms, including fungi, viruses, and bacteria. These organisms are so small that they can only be seen through a microscope. Bacteria are the microbes that most commonly cause UTIs.  SYMPTOMS   Symptoms of UTIs may vary by age and gender of the patient and by the location of the infection. Symptoms in young women typically include a frequent and intense urge to urinate and a painful, burning feeling in the bladder or urethra during urination. Older women and men are more likely to be tired, shaky, and weak and have muscle aches and abdominal pain. A fever may mean the infection is in your kidneys. Other symptoms of a kidney infection include pain in your back or sides below the ribs, nausea, and vomiting.  DIAGNOSIS  To diagnose a UTI, your caregiver will ask you about your symptoms. Your caregiver also will ask to provide a urine sample. The urine sample will be tested for bacteria and white blood cells. White blood cells are made by your body to help fight infection.  TREATMENT   Typically, UTIs can be treated with medication. Because most UTIs are caused by a bacterial infection, they usually can be treated with the use of antibiotics. The choice of antibiotic and length of treatment depend on your symptoms and the type of bacteria causing your infection.  HOME CARE INSTRUCTIONS   If you were prescribed antibiotics, take them exactly as your caregiver instructs you. Finish the medication even if you feel better after you  have only taken some of the medication.   Drink enough water and fluids to keep your urine clear or pale yellow.   Avoid caffeine, tea, and carbonated beverages. They tend to irritate your bladder.   Empty your bladder often. Avoid holding urine for long periods of time.   Empty your bladder before and after sexual intercourse.   After a bowel movement, women should cleanse from front to back. Use each tissue only once.  SEEK MEDICAL CARE IF:    You have back pain.   You develop a fever.   Your symptoms do not begin to resolve within 3 days.  SEEK IMMEDIATE MEDICAL CARE IF:    You have severe back pain or lower abdominal pain.   You develop chills.   You have nausea or vomiting.   You have continued burning or discomfort with urination.  MAKE SURE YOU:    Understand these instructions.   Will watch your condition.   Will get help right away if you are not doing well or get worse.  Document Released: 01/07/2005 Document Revised: 09/29/2011 Document Reviewed: 05/08/2011  [employer] Patient Information 2014 [employer]

## 2013-05-10 ENCOUNTER — Telehealth (HOSPITAL_COMMUNITY): Payer: Self-pay

## 2013-05-10 LAB — URINE CULTURE
Colony Count: NO GROWTH
Culture: NO GROWTH

## 2013-05-10 NOTE — ED Notes (Addendum)
Pharmacy called.  Pt had Rx for narcotic filled 05/08/2013 was [facility] 05/09/2013 do we want him to fill.  Per MD note no mention of pt having been seen or given narcotic Rx 1/26 noted.  RPh states will not fill.

## 2013-05-29 ENCOUNTER — Emergency Department: Payer: Self-pay | Admitting: Emergency Medicine

## 2013-05-29 LAB — COMPREHENSIVE METABOLIC PANEL WITH GFR
Albumin: 3.8 g/dL
Alkaline Phosphatase: 127 U/L — ABNORMAL HIGH
Anion Gap: 5 — ABNORMAL LOW
BUN: 14 mg/dL
Bilirubin,Total: 0.3 mg/dL
Calcium, Total: 9.6 mg/dL
Chloride: 110 mmol/L — ABNORMAL HIGH
Co2: 27 mmol/L
Creatinine: 1.24 mg/dL
EGFR (African American): 60 — ABNORMAL LOW
EGFR (Non-African Amer.): 52 — ABNORMAL LOW
Glucose: 101 mg/dL — ABNORMAL HIGH
Osmolality: 284
Potassium: 3.8 mmol/L
SGOT(AST): 21 U/L
SGPT (ALT): 16 U/L
Sodium: 142 mmol/L
Total Protein: 7.8 g/dL

## 2013-05-29 LAB — CBC
HCT: 42.1 % (ref 40.0–52.0)
HGB: 13.8 g/dL (ref 13.0–18.0)
MCH: 32.8 pg (ref 26.0–34.0)
MCHC: 32.8 g/dL (ref 32.0–36.0)
MCV: 100 fL (ref 80–100)
PLATELETS: 334 10*3/uL (ref 150–440)
RBC: 4.22 10*6/uL — AB (ref 4.40–5.90)
RDW: 13.3 % (ref 11.5–14.5)
WBC: 6.3 10*3/uL (ref 3.8–10.6)

## 2013-05-29 LAB — URINALYSIS, COMPLETE
Glucose,UR: NEGATIVE mg/dL
Nitrite: NEGATIVE
Ph: 5
Protein: 30
RBC,UR: 330 /HPF
Specific Gravity: 1.031
Squamous Epithelial: 1
WBC UR: 15 /HPF

## 2015-05-20 ENCOUNTER — Inpatient Hospital Stay
Admit: 2015-05-20 | Discharge: 2015-05-21 | Disposition: A | Discharge status: Home / Self Care | Payer: PRIVATE HEALTH INSURANCE

## 2015-05-20 DIAGNOSIS — N200 Calculus of kidney: Secondary | ICD-10-CM

## 2015-05-20 LAB — URINALYSIS W/RFL TO MICROSCOPIC
Bilirubin, UA: NEGATIVE
Glucose, UA: NEGATIVE mg/dL
Ketones, UA: NEGATIVE mg/dL
Nitrite, UA: NEGATIVE
Protein, UA: NEGATIVE mg/dL
RBC, UA: 3 /HPF (ref 0–3)
Specific Gravity, UA: 1.009 (ref 1.005–1.035)
Urobilinogen, UA: <2 mg/dL (ref 0.2–1.9)
WBC, UA: 4 /HPF (ref 0–5)
pH, UA: 6 (ref 5.0–8.0)

## 2015-05-20 MED ORDER — traMADolULTRAM50mgtablet
50 | ORAL_TABLET | Freq: Four times a day (QID) | ORAL | Status: AC | PRN
Start: 2015-05-20 — End: ?

## 2015-05-20 MED ORDER — oxyCODONE (ROXICODONE) immediate release tablet 5 mg
5 | Freq: Once | ORAL | Status: AC
Start: 2015-05-20 — End: 2015-05-20
  Administered 2015-05-21: 01:00:00 5 mg via ORAL

## 2015-05-20 MED ORDER — ondansetron (ZOFRAN-ODT) 8 MG disintegrating tablet
8 | ORAL_TABLET | Freq: Three times a day (TID) | ORAL | Status: AC | PRN
Start: 2015-05-20 — End: ?

## 2015-05-20 MED ORDER — ondansetron (ZOFRAN) tablet 4 mg
4 | Freq: Once | ORAL | Status: AC
Start: 2015-05-20 — End: 2015-05-20
  Administered 2015-05-21: 01:00:00 4 mg via ORAL

## 2015-05-20 MED FILL — ONDANSETRON HCL 4 MG TABLET: 4 4 MG | ORAL | Qty: 1

## 2015-05-20 MED FILL — OXYCODONE 5 MG TABLET: 5 5 MG | ORAL | Qty: 1

## 2015-05-20 NOTE — Unmapped (Signed)
Pt complaining of left sided flank pain rated 7 of 10. Pt states he has been having the pain for about 5 hours, it radiates into left testicle. Alert and oriented x4. Side rails up, family at bedside.

## 2015-05-20 NOTE — Unmapped (Signed)
You were seen in the Emergency Department for nephrolithiasis    You were prescribed Zofran and Ultram for pain. Take it as directed     Please follow up with your primary care physician.     Please return to the ED if your symptoms worsen or do not resolve; or if you develop any chest pain, shortness of breath, abdominal pain, uncontrollable vomiting, unable to eat, passing out, difficulty moving your arms or legs, difficulty speaking, fever (greater than 101 degrees), or any concern that you feel needs acute physician evaluation.

## 2015-05-20 NOTE — Unmapped (Signed)
ED Attending Attestation Note    Date of service:  05/20/2015    This patient was seen by the resident physician.  I have seen and examined the patient, agree with the workup, evaluation, management and diagnosis. The care plan has been discussed and I concur.     My assessment reveals a 50 y.o. male who reports a history of bilateral nephrolithiasis stating my kidneys are full of him was required prior stents and prior lithotripsies, who is visiting this area from [place] in returning home later tonight who presents the emergency department stating that he had acute onset of left flank pain radiating to his left lower quadrant and left testicle that began 4-5 hours ago and it is typical of prior episodes of kidney stones.  He has not noticed any frank blood in his urine, but he does state that he's had urinary frequency but without burning on urination.  He has felt nauseated but he has not vomited.  Earlier in the day he was in his normal state of health.  He has had no fever.    On examination he was no acute distress.  The skin is warm and dry.  Mucous membranes are moist and pink.  The lungs are clear.  Cardiac exam showed a regular rate and rhythm with normal S1 and S2.  His abdomen showed very mild left lower quadrant tenderness and left upper quadrant tenderness without rebound or guarding.  He did have left costovertebral angle tenderness.

## 2015-05-20 NOTE — Unmapped (Signed)
ED Note    Date of Service: ??05/20/2015    Reason for Visit: Flank Pain      Patient History     HPI:  Dennis Reeves is a 50 y.o. male who presents to the emergency department with complaints of3 day history of left-sided flank pain.  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  He has acute onset pain consistent with previous kidney stones with no hematuria, no pain or discharge and no fever.  He reports abdominal pain and only has left sided flank pain that radiates to his left testicle.  He reports no other alleviating or exacerbation factors or other associated symptoms..      PMH: Nursing notes reviewed   PSH: Nursing notes reviewed   FH: Nursing notes reviewed   MEDS: Nursing notes and chart reviewed   ALLERGIES: Nursing notes and chart reviewed    Past Medical History   Diagnosis Date   ??? Kidney stones    ??? Kidney stones        Past Surgical History   Procedure Laterality Date   ??? Lithotripsy     ??? Eye surgery     ??? Wrist surgery Right        Dennis Reeves  reports that he has been smoking Cigarettes.  He has been smoking about 0.50 packs per day. He does not have any smokeless tobacco history on file. He reports that he does not drink alcohol or use illicit drugs.    Previous Medications    No medications on file       Allergies:   Allergies as of 05/20/2015 - Fully Reviewed 05/20/2015   Allergen Reaction Noted   ??? Flomax [tamsulosin] Other (See Comments) 05/20/2015   ??? Phenergan [promethazine] Nausea And Vomiting 05/20/2015   ??? Toradol [ketorolac] Itching 05/20/2015       Review of Systems     Dennis Reeves denies abdominal pain, chest pain, shortness of breath, headache, skin changes, change in bowel or bladder habits or change in by mouth intake. Pt endorses left flank pain    ROS: A 12 point ROS was obtained, all pertinent positives and negatives are listed in the HPI. All other systems were negative.    Physical Exam     ED Triage  Vitals   Vital Signs Group      Temp 05/20/15 1759 98.2 ??F (36.8 ??C)      Temp Source 05/20/15 1759 Oral      Heart Rate 05/20/15 1759 78      Heart Rate Source 05/20/15 1759 Monitor;Automatic      Resp 05/20/15 1759 18      SpO2 05/20/15 1759 100 %      BP 05/20/15 1759 148/97 mmHg      BP Location 05/20/15 1759 Right arm      BP Method 05/20/15 1759 Automatic      Patient Position 05/20/15 1759 Sitting   SpO2 05/20/15 1759 100 %   O2 Device 05/20/15 1759 None (Room air)       General:  well nourished; well developed; in no apparent distress    HEENT:  Head atraumatic, pupils equal round and reactive to light, extraocular movements intact, sclera clear, mucus membranes moist, oropharynx nonerythematous    Neck:  Supple, no lymphadenopathy, no meningismus; trachea midline    Pulmonary:   Clear to auscultation bilaterally, no wheezes, rhonchi, or rales  Cardiac:  Regular rate and rhythm, normal S1S2, no murmurs, rubs, or gallops    Abdomen:  Soft, nontender, nondistended, no rebound and no guarding    Musculoskeletal:  No obvious deformities, left CVA tenderness to palpation    Vascular:  2+ radial pulses bilaterally, 2+ dorsalis pedis pulses bilaterally    Skin:  Warm, dry, well perfused, no rashes    Neuro:  Alert and oriented x4, speech is clear and intact without dysarthria, gait intact    Psych:  Appropriate mood and affect    Diagnostic Studies     Labs:    Please see electronic medical record for any tests performed in the ED     Radiology:    No orders to display       No tests were performed during this ED visit    EKG:    No EKG Performed    Emergency Department Procedures     renal US, please see procedure note for specific details.    ED Course and MDM     Dennis Reeves is a 50 y.o. male patient with a past medical history significant for Nephrolithiasis who presented to the emergency department with nephrolithiasis.     The patient was evaluated by me and the attending physician Dr Marylou Flesher. All care  plans were discussed and agreed upon.    Upon initial assessment of the patient in emergency department he appears very well normal vital signs.  He does have some left-sided flank pain.  Bedside ultrasound was completed and showed no evidence of hydronephrosis.  He receives oral medications for his discomfort and nausea which decreased his symptoms.  He has a urologist back in [place] where he is going home to tonight and can follow-up as an outpatient.  At this time to be discharged and stable condition.  I have no concern for urinary tract infection in this patient he will not return to [place] and with antibiotics at this time.    The patient was deemed a good candidate for continued management in the outpatient setting. The patient was advised to follow up with  PCP     At this time the patient has been deemed safe for discharge. My customary discharge instructions including strict return precautions for worsening or new symptoms have been communicated.    Summary of Treatment in ED:  Medications - No data to display    Impression   1.  Nephrolithiasis      Roylene Reason, MD  PGY3 Emergency Medicine  [facility]         Roylene Reason, MD  Resident  05/20/15 2020    Roylene Reason, MD  Resident  05/20/15 [phone]

## 2015-10-09 IMAGING — CT CT ABD-PELV W/O CM
2 of 4 series · 17 of 46 positions shown, 19 images · non-contrast
Comparison: 03/28/2013

CLINICAL DATA: Left flank pain, dysuria, history of nephrolithiasis

EXAM:
CT ABDOMEN AND PELVIS WITHOUT CONTRAST
TECHNIQUE: Multidetector CT imaging of the abdomen and pelvis was performed
following the standard protocol without IV contrast.

[Series 2: renal stone > 200 lbs 5.0 b31f · axial · 0.72mm/px · z∈[-435,-55]mm · 14 of 84 slices shown, 16 images]
[im 4/84  soft-tissue]
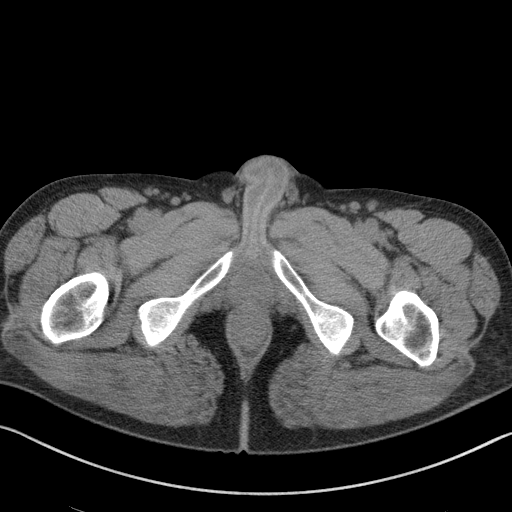
[im 4/84  bone]
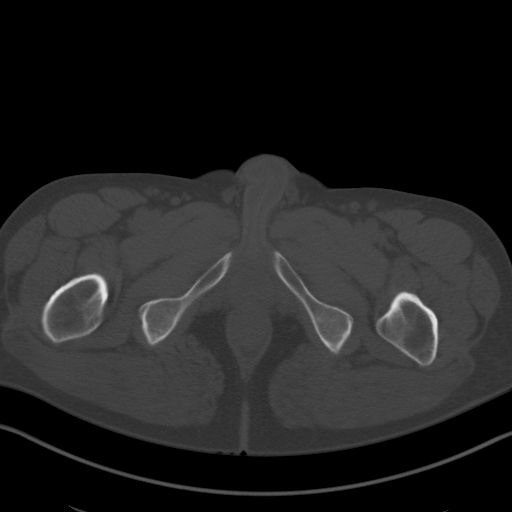
[im 10/84  soft-tissue]
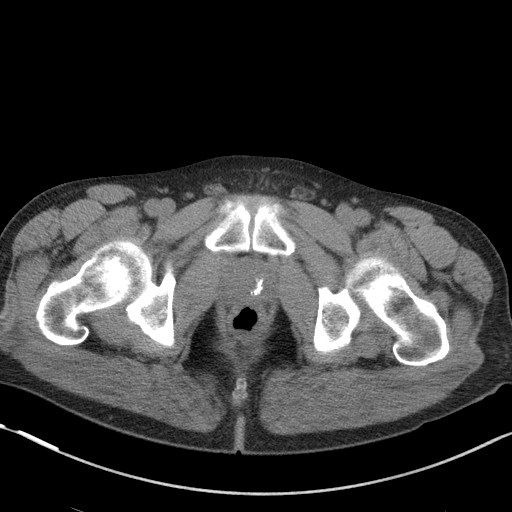
[im 17/84  soft-tissue]
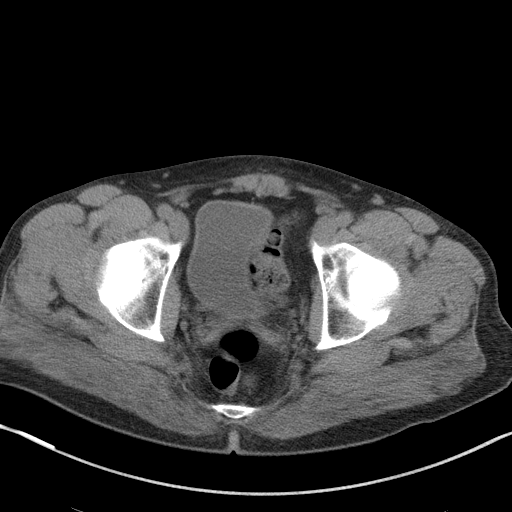
[im 24/84  soft-tissue]
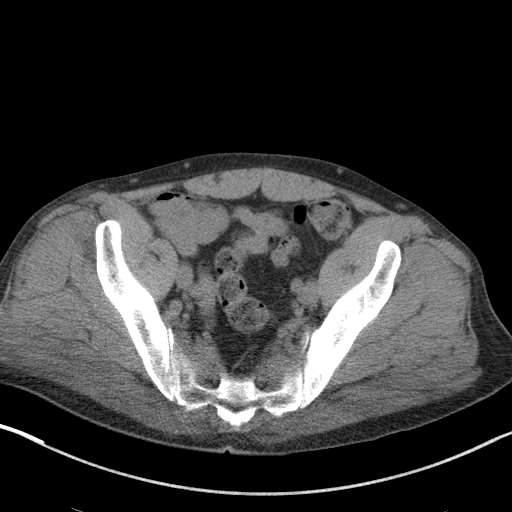
[im 27/84  soft-tissue]
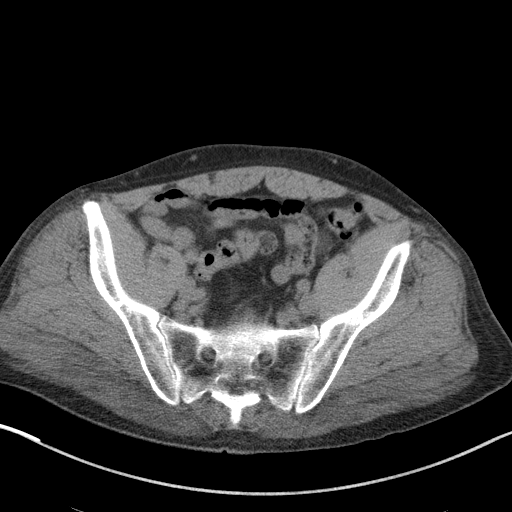
[im 34/84  soft-tissue]
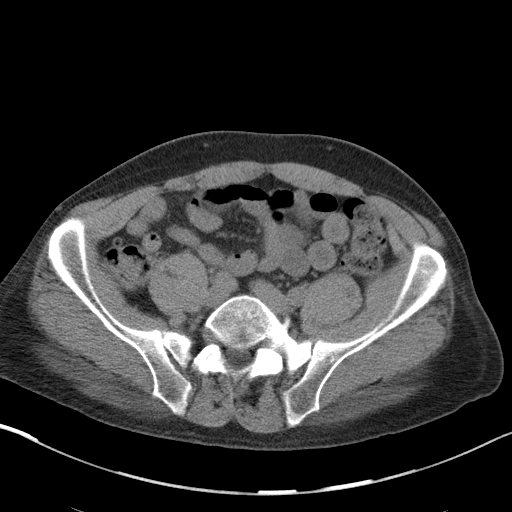
[im 40/84  soft-tissue]
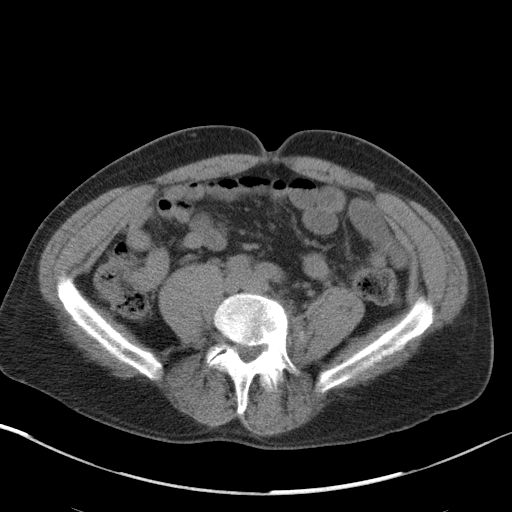
[im 44/84  soft-tissue]
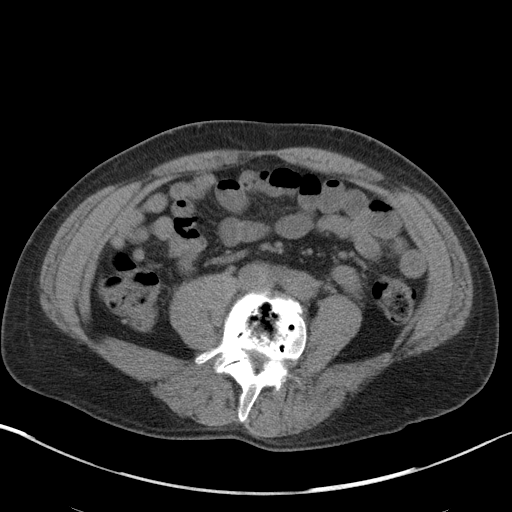
[im 50/84  soft-tissue]
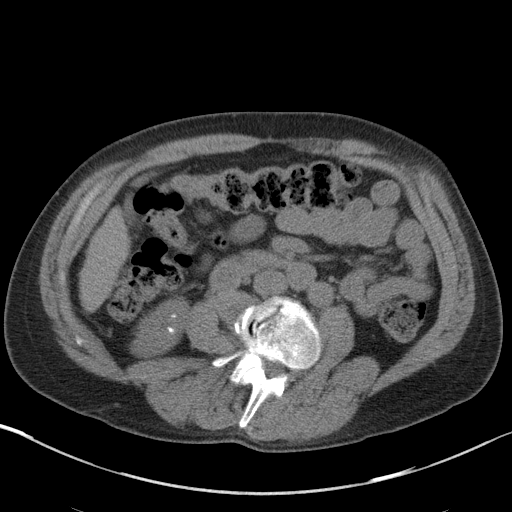
[im 50/84  bone]
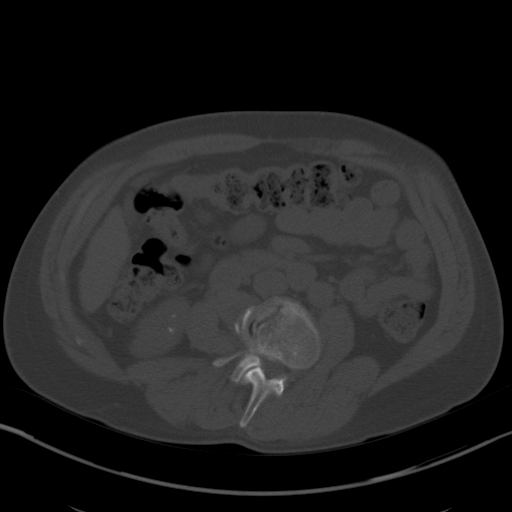
[im 57/84  soft-tissue]
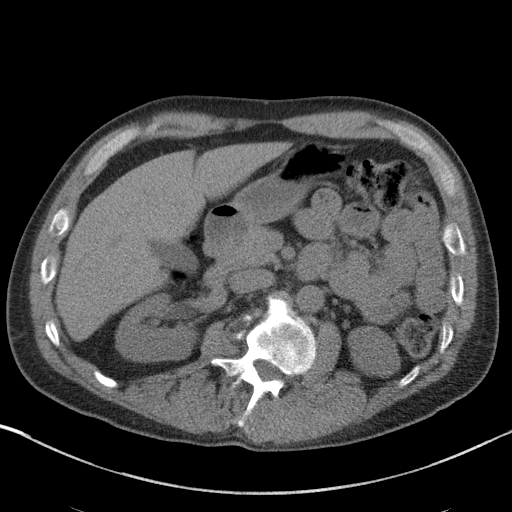
[im 64/84  soft-tissue]
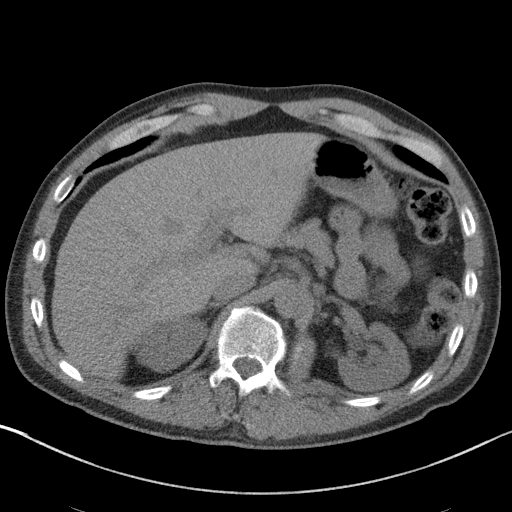
[im 67/84  soft-tissue]
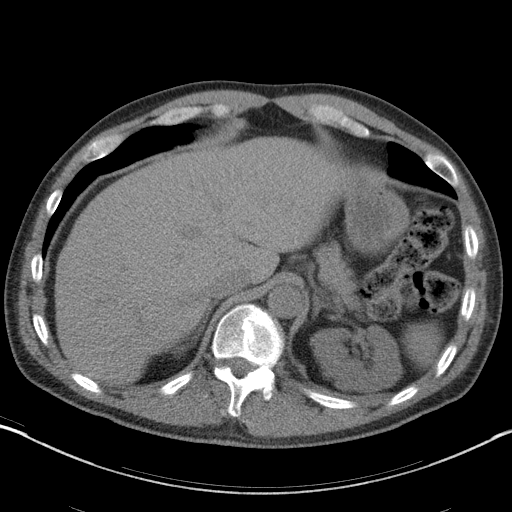
[im 74/84  soft-tissue]
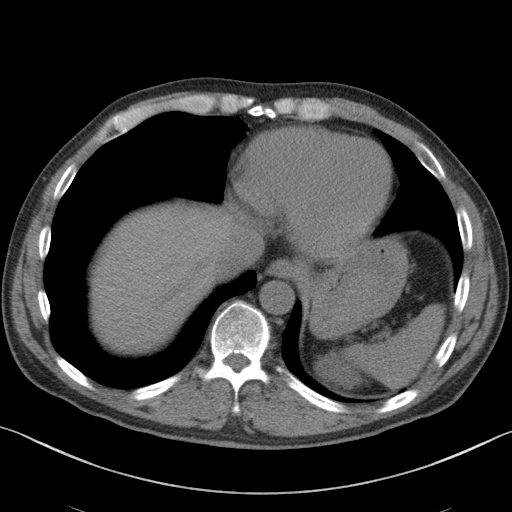
[im 80/84  soft-tissue]
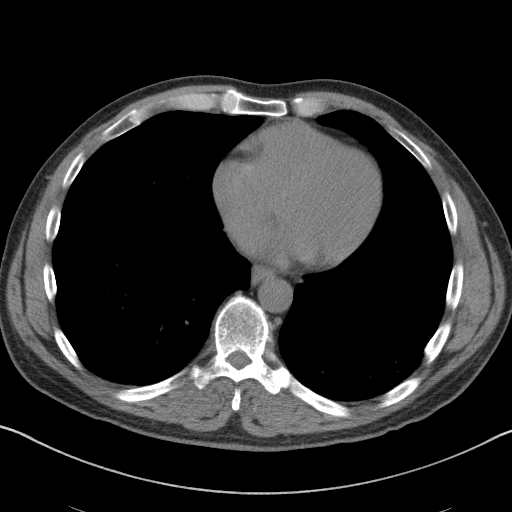

[Series 5: renal stone 3.0 coronal · coronal · 0.82mm/px · 3 of 80 slices shown]
[im 27/80  soft-tissue]
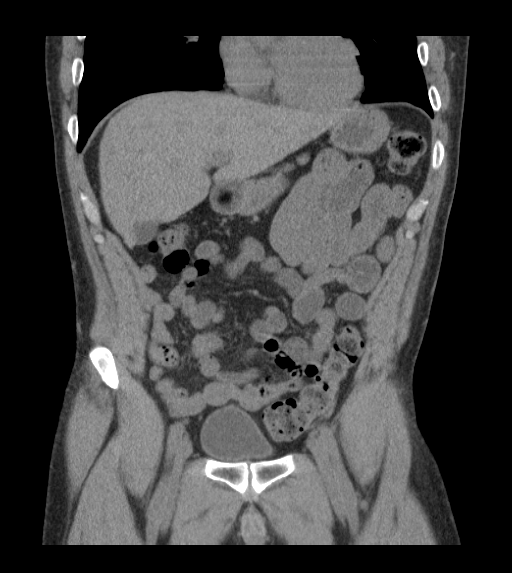
[im 36/80  soft-tissue]
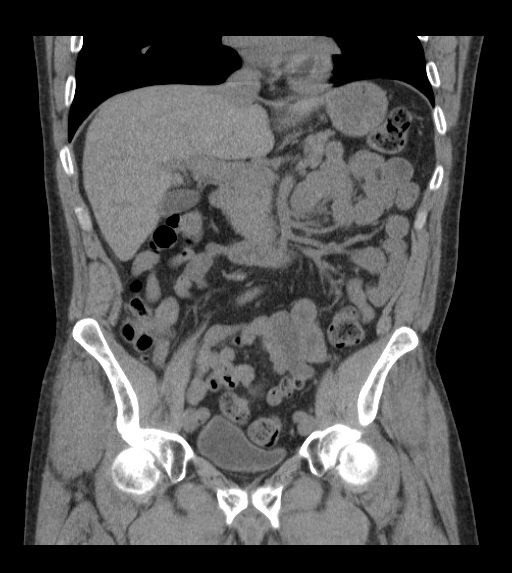
[im 44/80  soft-tissue]
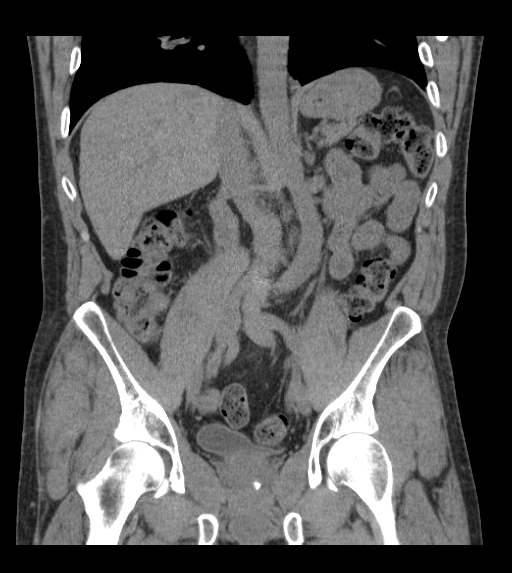

[17 of 46 positions shown; findings below may reference images not displayed]

FINDINGS: Minor dependent basilar atelectasis. Normal heart size. No
pericardial or pleural effusion. No lower lobe pneumonia. Negative
for hilar hernia.

Abdomen: Scattered punctate nonobstructing intrarenal calculi
throughout both kidneys. These bilateral intrarenal calculi are all
sub cm in size.

No hydronephrosis, perinephric inflammatory process, obstructive
uropathy, or obstructing ureteral calculus on either side. Kidneys
appear stable compared to the prior study.

Liver, gallbladder, biliary system, pancreas, spleen, and adrenal
glands are within normal limits for age and a noncontrast imaging.

Negative for bowel obstruction, dilatation, ileus, or free air.
Normal appendix demonstrated.

No abdominal or pelvic free fluid, fluid collection, hemorrhage,
abscess or adenopathy. No inguinal abnormality or hernia.

Pelvis: Urinary bladder unremarkable. Prostate calcifications noted.
No distal ureteral or UVJ calculus.

Diffuse degenerative changes of the spine with an associated
scoliosis.
IMPRESSION: Nonobstructing intrarenal calculi bilaterally.

Negative for hydronephrosis, obstructive uropathy, or obstructing
ureteral calculus on either side.

Normal appendix

No acute intra-abdominal or pelvic finding.
